# Patient Record
Sex: Female | Born: 1995 | Race: White | Hispanic: No | Marital: Single | Smoking: Never smoker
Health system: Southern US, Academic
[De-identification: ages and names within clinical notes are randomized; demographics above are authoritative.]

## PROBLEM LIST (undated history)

## (undated) DIAGNOSIS — R569 Unspecified convulsions: Secondary | ICD-10-CM

## (undated) DIAGNOSIS — M419 Scoliosis, unspecified: Secondary | ICD-10-CM

---

## 2015-10-23 ENCOUNTER — Other Ambulatory Visit (HOSPITAL_COMMUNITY): Payer: Self-pay | Admitting: EXTERNAL

## 2021-11-14 ENCOUNTER — Other Ambulatory Visit (HOSPITAL_COMMUNITY): Payer: Self-pay

## 2021-11-14 DIAGNOSIS — R1084 Generalized abdominal pain: Secondary | ICD-10-CM

## 2021-11-16 ENCOUNTER — Inpatient Hospital Stay
Admission: RE | Admit: 2021-11-16 | Discharge: 2021-11-16 | Disposition: A | Discharge status: Home / Self Care | Payer: Medicare Other | Source: Ambulatory Visit

## 2021-11-16 ENCOUNTER — Other Ambulatory Visit (HOSPITAL_BASED_OUTPATIENT_CLINIC_OR_DEPARTMENT_OTHER): Payer: Medicare Other

## 2021-11-16 ENCOUNTER — Other Ambulatory Visit: Payer: Self-pay

## 2021-11-16 ENCOUNTER — Other Ambulatory Visit (HOSPITAL_COMMUNITY): Payer: Medicare Other

## 2021-11-16 DIAGNOSIS — R1084 Generalized abdominal pain: Secondary | ICD-10-CM | POA: Insufficient documentation

## 2021-11-16 MED ORDER — IOHEXOL 350 MG IODINE/ML INTRAVENOUS SOLUTION
100.0000 mL | INTRAVENOUS | Status: AC
Start: 2021-11-16 — End: 2021-11-16
  Administered 2021-11-16: 75 mL via INTRAVENOUS

## 2021-11-20 ENCOUNTER — Other Ambulatory Visit (HOSPITAL_BASED_OUTPATIENT_CLINIC_OR_DEPARTMENT_OTHER): Payer: Medicare Other

## 2022-01-27 ENCOUNTER — Emergency Department
Admission: EM | Admit: 2022-01-27 | Discharge: 2022-01-27 | Disposition: A | Discharge status: Home / Self Care | Payer: Medicare Other | Attending: FAMILY PRACTICE | Admitting: FAMILY PRACTICE

## 2022-01-27 ENCOUNTER — Emergency Department (HOSPITAL_BASED_OUTPATIENT_CLINIC_OR_DEPARTMENT_OTHER): Payer: No Typology Code available for payment source

## 2022-01-27 ENCOUNTER — Encounter (HOSPITAL_BASED_OUTPATIENT_CLINIC_OR_DEPARTMENT_OTHER): Payer: Self-pay

## 2022-01-27 ENCOUNTER — Other Ambulatory Visit: Payer: Self-pay

## 2022-01-27 DIAGNOSIS — G40909 Epilepsy, unspecified, not intractable, without status epilepticus: Secondary | ICD-10-CM | POA: Insufficient documentation

## 2022-01-27 DIAGNOSIS — F061 Catatonic disorder due to known physiological condition: Secondary | ICD-10-CM

## 2022-01-27 HISTORY — DX: Scoliosis, unspecified: M41.9

## 2022-01-27 HISTORY — DX: Unspecified convulsions: R56.9

## 2022-01-27 LAB — CBC WITH DIFF
BASOPHIL #: 0.02 10*3/uL (ref 0.00–0.30)
BASOPHIL %: 0 % (ref 0–3)
EOSINOPHIL #: 0.14 10*3/uL (ref 0.00–0.80)
EOSINOPHIL %: 3 % (ref 0–7)
HCT: 38.9 % (ref 37.0–47.0)
HGB: 12.7 g/dL (ref 12.5–16.0)
LYMPHOCYTE #: 1.23 10*3/uL (ref 1.10–5.00)
LYMPHOCYTE %: 23 % — ABNORMAL LOW (ref 25–45)
MCH: 23.3 pg — ABNORMAL LOW (ref 27.0–32.0)
MCHC: 32.5 g/dL (ref 32.0–36.0)
MCV: 71.7 fL — ABNORMAL LOW (ref 78.0–99.0)
MONOCYTE #: 0.4 10*3/uL (ref 0.00–1.30)
MONOCYTE %: 7 % (ref 0–12)
MPV: 9.6 fL (ref 7.4–10.4)
NEUTROPHIL #: 3.66 10*3/uL (ref 1.80–8.40)
NEUTROPHIL %: 67 % (ref 40–76)
PLATELETS: 355 10*3/uL (ref 140–440)
RBC: 5.42 10*6/uL — ABNORMAL HIGH (ref 4.20–5.40)
RDW: 20.9 % — ABNORMAL HIGH (ref 11.6–14.8)
WBC: 5.5 10*3/uL (ref 4.0–10.5)

## 2022-01-27 LAB — BASIC METABOLIC PANEL
ANION GAP: 11 mmol/L (ref 10–20)
BUN/CREA RATIO: 11
BUN: 6 mg/dL — ABNORMAL LOW (ref 7–18)
CALCIUM: 8.7 mg/dL (ref 8.5–10.1)
CHLORIDE: 96 mmol/L — ABNORMAL LOW (ref 98–107)
CO2 TOTAL: 25 mmol/L (ref 21–32)
CREATININE: 0.57 mg/dL (ref 0.55–1.02)
ESTIMATED GFR: 129 mL/min/{1.73_m2} (ref >59)
GLUCOSE: 98 mg/dL (ref 74–106)
OSMOLALITY, CALCULATED: 262 mOsm/kg — ABNORMAL LOW (ref 270–290)
POTASSIUM: 4 mmol/L (ref 3.5–5.1)
SODIUM: 132 mmol/L — ABNORMAL LOW (ref 136–145)

## 2022-01-27 LAB — URINE DRUG SCREEN
AMPHETAMINES URINE: NEGATIVE
BARBITURATES URINE: NEGATIVE
BENZODIAZEPINES URINE: NEGATIVE
CANNABINOIDS URINE: NEGATIVE
COCAINE METABOLITES URINE: NEGATIVE
METHADONE URINE: NEGATIVE
OPIATES URINE: NEGATIVE
PCP URINE: NEGATIVE

## 2022-01-27 LAB — URINALYSIS, MACRO/MICRO
BILIRUBIN: NEGATIVE mg/dL
BLOOD: NEGATIVE mg/dL
GLUCOSE: NEGATIVE mg/dL
LEUKOCYTES: NEGATIVE WBCs/uL
NITRITE: NEGATIVE
PH: 7 (ref 4.6–8.0)
PROTEIN: NEGATIVE mg/dL
SPECIFIC GRAVITY: <=1.005 (ref 1.003–1.035)
UROBILINOGEN: 0.2 mg/dL (ref 0.2–1.0)

## 2022-01-27 LAB — ETHANOL, SERUM/PLASMA: ETHANOL: <3 mg/dL (ref <=3)

## 2022-01-27 LAB — HCG, URINE QUALITATIVE, PREGNANCY: HCG URINE QUALITATIVE: NEGATIVE

## 2022-01-27 LAB — POC BLOOD GLUCOSE (RESULTS): GLUCOSE, POC: 99 mg/dl (ref 50–500)

## 2022-01-27 LAB — ACETAMINOPHEN LEVEL: ACETAMINOPHEN LEVEL: 0

## 2022-01-27 LAB — TEGRETOL LEVEL: CARBAMAZEPINE LEVEL: 10.6 ug/mL (ref 4.0–12.0)

## 2022-01-27 MED ORDER — SODIUM CHLORIDE 0.9 % (FLUSH) INJECTION SYRINGE
3.0000 mL | INJECTION | Freq: Three times a day (TID) | INTRAMUSCULAR | Status: DC
Start: 2022-01-27 — End: 2022-01-27

## 2022-01-27 MED ORDER — SODIUM CHLORIDE 0.9 % (FLUSH) INJECTION SYRINGE
3.0000 mL | INJECTION | INTRAMUSCULAR | Status: DC | PRN
Start: 2022-01-27 — End: 2022-01-27

## 2022-01-27 NOTE — ED Nurses Note (Signed)
Patient in bed with seizure pads in place to both side rails. Father at bedside.

## 2022-01-27 NOTE — ED Provider Notes (Signed)
[facility], [facility] Emergency Department  ED Primary Provider Note  History of Present Illness   Chief Complaint   Patient presents with   . Seizure Prior Hx Of     Shannon Rodriguez is a 26 y.o. female who had concerns including Seizure Prior Hx Of.  Arrival: The patient arrived by Ambulance    This 26 year old female patient presents to the emergency department with seizures, EMS notes that she apparently has had several seizures today has had a history of seizures.  She takes Vimpat, gabapentin, and Tegretol for her seizures.  Patient does appear to have rhythmic type catatonic motion last for about 30-45 seconds, she is had 2 witnessed while I was in the room.  Her father states she is been evaluated [facility], and by neurosurgeon in [place] had not sure why she has the seizures.  He states he is had about 35 seizures so far today, they last 30 seconds to 45 seconds, no postictal state.  He says the seizures started at age 60 months.          Review of Systems   Pertinent positive and negative ROS as per HPI.  Historical Data   History Reviewed This Encounter:  Past medical surgical social history per dad and chart review, reviewed and noted.    Physical Exam   ED Triage Vitals   BP (Non-Invasive) 01/27/22 1735 (!) 144/97   Heart Rate 01/27/22 1735 100   Respiratory Rate 01/27/22 1733 18   Temperature 01/27/22 1735 37.4 C (99.3 F)   SpO2 01/27/22 1735 97 %   Weight 01/27/22 1733 56.2 kg (124 lb)   Height 01/27/22 1733 1.626 m (_0 )     Physical Exam   General:  No acute distress, nontoxic   Head: Normocephalic, atraumatic   Eyes:  Sclera is white, conjunctivae is pink.  PERRLA, EOMI   Ears:  TMs clear throat for traction   Nasal:  Pink and moist   Oral:  Pink and moist   Pharynx:  Pink moist without PND, exudate, petechiae   Neck:  Supple without accessory muscle use to breathe.  Trachea is in midline.    Lungs:  Clear symmetrical with good aeration   Heart: Regular rate  rhythm S1-S2 without murmur gallop   Abdomen:  Soft normal bowel sounds nontender   Extremities:  Moving symmetrically  Skin:  No suspicious rashes lesions   Neurological:  No focal motor sensory deficits.      Patient Data     Labs Ordered/Reviewed   BASIC METABOLIC PANEL - Abnormal; Notable for the following components:       Result Value    SODIUM 132 (*)     CHLORIDE 96 (*)     BUN 6 (*)     OSMOLALITY, CALCULATED 262 (*)     All other components within normal limits    Narrative:     Estimated Glomerular Filtration Rate (eGFR) is calculated using the CKD-EPI (2021) equation, intended for patients 38 years of age and older. If gender is not documented or "unknown", there will be no eGFR calculation.   CBC WITH DIFF - Abnormal; Notable for the following components:    RBC 5.42 (*)     MCV 71.7 (*)     MCH 23.3 (*)     RDW 20.9 (*)     LYMPHOCYTE % 23 (*)     All other components within normal limits   URINALYSIS,  MACRO/MICRO - Abnormal; Notable for the following components:    COLOR Light Yellow (*)     KETONES Trace (*)     All other components within normal limits   ACETAMINOPHEN LEVEL - Normal   ETHANOL, SERUM - Normal   URINE DRUG SCREEN - Normal   POC BLOOD GLUCOSE (RESULTS) - Normal   CBC/DIFF    Narrative:     The following orders were created for panel order CBC/DIFF.  Procedure                               Abnormality         Status                     ---------                               -----------         ------                     CBC WITH [facility][[id number]]                Abnormal            Final result                 Please view results for these tests on the individual orders.   URINALYSIS WITH REFLEX MICROSCOPIC AND CULTURE IF POSITIVE    Narrative:     The following orders were created for panel order URINALYSIS WITH REFLEX MICROSCOPIC AND CULTURE IF POSITIVE.  Procedure                               Abnormality         Status                     ---------                                -----------         ------                     URINALYSIS, MACRO/MICRO[[id number]]      Abnormal            Final result                 Please view results for these tests on the individual orders.   HCG, URINE QUALITATIVE, PREGNANCY   TEGRETOL LEVEL   PERFORM POC WHOLE BLOOD GLUCOSE     No orders to display     Medical Decision Making        Medical Decision Making  High complexity due presentation, could be catatonia, seizures, pseudoseizures, electrolyte imbalance, drug toxicities.    Amount and/or Complexity of Data Reviewed  Labs: ordered.  ECG/medicine tests: ordered.     Details: Sinus rhythm at 93 with normal axis normal intervals.      Risk  Prescription drug management.        ED Course as of 01/27/22 1933   Nancy Fetter Jan 27, 2022   1911 Patient re-evaluated, labs reviewed.  She has had no further seizures since the 1st half an hour  or so here in the emergency department.  With her in depth chart reviewed, and noted ictal SPECT imagery of the brain showing no seizures twice at [facility], and this looks like some sort of catatonic movement of short duration she will be discharged home, follow-up with the primary medical provider and neurology.  Continue home medications as prescribed.  Tegretol level still pending, patient can be notified if the level is high or low.         Medications Administered in the ED   NS flush syringe (has no administration in time range)   NS flush syringe (has no administration in time range)     Clinical Impression   Catatonia (Primary)       Disposition: Discharged    .Marland KitchenBretta Bang, DO

## 2022-01-27 NOTE — ED Triage Notes (Signed)
Father states the pt has had several seizures today and has been given a total of 9mg  ativan at intervals PO. 

## 2022-01-27 NOTE — ED Nurses Note (Signed)
Patient discharged home with family.  AVS reviewed with patient/care giver.  A written copy of the AVS and discharge instructions was given to the patient/care giver.  Questions sufficiently answered as needed.  Patient/care giver encouraged to follow up with PCP as indicated.  In the event of an emergency, patient/care giver instructed to call 911 or go to the nearest emergency room.

## 2022-01-27 NOTE — Discharge Instructions (Signed)
The appearance of your abnormal movements today of very short duration, no restful stayed or sleepy state afterwards maybe a catatonic reaction or conversion disorder with movement disorders.  Contents reviewed the chart, including SPECT imagery of the brain during a seizure-type event did not show any significant abnormalities.  Continue all of your home medications, follow-up with your primary care provider, and the neurologist that you have an appointment scheduled with in [place] as scheduled.

## 2022-01-28 LAB — ECG 12 LEAD
Atrial Rate: 93 {beats}/min
Calculated P Axis: 77 degrees
Calculated R Axis: 26 degrees
Calculated T Axis: 32 degrees
PR Interval: 158 ms
QRS Duration: 94 ms
QT Interval: 344 ms
QTC Calculation: 427 ms
Ventricular rate: 93 {beats}/min

## 2022-03-25 ENCOUNTER — Emergency Department
Admission: EM | Admit: 2022-03-25 | Discharge: 2022-03-25 | Disposition: A | Discharge status: Home / Self Care | Payer: Medicare Other | Attending: Family | Admitting: Family

## 2022-03-25 ENCOUNTER — Other Ambulatory Visit: Payer: Self-pay

## 2022-03-25 DIAGNOSIS — R569 Unspecified convulsions: Secondary | ICD-10-CM

## 2022-03-25 DIAGNOSIS — Z5329 Procedure and treatment not carried out because of patient's decision for other reasons: Secondary | ICD-10-CM

## 2022-03-25 DIAGNOSIS — G40901 Epilepsy, unspecified, not intractable, with status epilepticus: Secondary | ICD-10-CM

## 2022-03-25 LAB — COMPREHENSIVE METABOLIC PANEL, NON-FASTING
ALBUMIN/GLOBULIN RATIO: 1.2 (ref 0.8–1.4)
ALBUMIN: 3.9 g/dL (ref 3.4–5.0)
ALKALINE PHOSPHATASE: 68 U/L (ref 46–116)
ALT (SGPT): 25 U/L (ref <=78)
ANION GAP: 10 mmol/L (ref 4–13)
AST (SGOT): 19 U/L (ref 15–37)
BILIRUBIN TOTAL: 0.2 mg/dL (ref 0.2–1.0)
BUN/CREA RATIO: 16
BUN: 9 mg/dL (ref 7–18)
CALCIUM, CORRECTED: 8.5 mg/dL
CALCIUM: 8.4 mg/dL — ABNORMAL LOW (ref 8.5–10.1)
CHLORIDE: 95 mmol/L — ABNORMAL LOW (ref 98–107)
CO2 TOTAL: 26 mmol/L (ref 21–32)
CREATININE: 0.56 mg/dL (ref 0.55–1.02)
ESTIMATED GFR: 129 mL/min/{1.73_m2} (ref >59)
GLOBULIN: 3.3
GLUCOSE: 101 mg/dL (ref 74–106)
OSMOLALITY, CALCULATED: 262 mOsm/kg — ABNORMAL LOW (ref 270–290)
POTASSIUM: 4.5 mmol/L (ref 3.5–5.1)
PROTEIN TOTAL: 7.2 g/dL (ref 6.4–8.2)
SODIUM: 131 mmol/L — ABNORMAL LOW (ref 136–145)

## 2022-03-25 LAB — CBC WITH DIFF
BASOPHIL #: 0.01 10*3/uL (ref 0.00–0.30)
BASOPHIL %: 0 % (ref 0–3)
EOSINOPHIL #: 0.06 10*3/uL (ref 0.00–0.80)
EOSINOPHIL %: 2 % (ref 0–7)
HCT: 36.1 % — ABNORMAL LOW (ref 37.0–47.0)
HGB: 11.8 g/dL — ABNORMAL LOW (ref 12.5–16.0)
LYMPHOCYTE #: 0.96 10*3/uL — ABNORMAL LOW (ref 1.10–5.00)
LYMPHOCYTE %: 26 % (ref 25–45)
MCH: 23.2 pg — ABNORMAL LOW (ref 27.0–32.0)
MCHC: 32.7 g/dL (ref 32.0–36.0)
MCV: 71 fL — ABNORMAL LOW (ref 78.0–99.0)
MONOCYTE #: 0.35 10*3/uL (ref 0.00–1.30)
MONOCYTE %: 9 % (ref 0–12)
MPV: 7.7 fL (ref 7.4–10.4)
NEUTROPHIL #: 2.39 10*3/uL (ref 1.80–8.40)
NEUTROPHIL %: 63 % (ref 40–76)
PLATELETS: 286 10*3/uL (ref 140–440)
RBC: 5.08 10*6/uL (ref 4.20–5.40)
RDW: 20.3 % — ABNORMAL HIGH (ref 11.6–14.8)
WBC: 3.8 10*3/uL — ABNORMAL LOW (ref 4.0–10.5)

## 2022-03-25 LAB — CREATINE KINASE (CK), TOTAL, SERUM: CREATINE KINASE: 33 U/L (ref 26–192)

## 2022-03-25 LAB — LACTIC ACID LEVEL W/ REFLEX FOR LEVEL >2.0: LACTIC ACID: 0.9 mmol/L (ref 0.4–2.0)

## 2022-03-25 MED ORDER — SODIUM CHLORIDE 0.9 % (FLUSH) INJECTION SYRINGE
3.0000 mL | INJECTION | INTRAMUSCULAR | Status: DC | PRN
Start: 2022-03-25 — End: 2022-03-25

## 2022-03-25 MED ORDER — LORAZEPAM 2 MG TABLET
2.0000 mg | ORAL_TABLET | Freq: Four times a day (QID) | ORAL | 0 refills | Status: DC
Start: 2022-03-25 — End: 2023-03-21

## 2022-03-25 MED ORDER — LORAZEPAM 2 MG/ML INJECTION WRAPPER
1.0000 mg | INTRAMUSCULAR | Status: AC
Start: 2022-03-25 — End: 2022-03-25
  Administered 2022-03-25: 1 mg via INTRAVENOUS

## 2022-03-25 MED ORDER — LORAZEPAM 1 MG TABLET
ORAL_TABLET | ORAL | Status: AC
Start: 2022-03-25 — End: 2022-03-25
  Filled 2022-03-25: qty 1

## 2022-03-25 MED ORDER — SODIUM CHLORIDE 0.9 % IV BOLUS
1000.0000 mL | INJECTION | Status: AC
Start: 2022-03-25 — End: 2022-03-25
  Administered 2022-03-25: 1000 mL via INTRAVENOUS
  Administered 2022-03-25: 0 mL via INTRAVENOUS

## 2022-03-25 MED ORDER — LORAZEPAM 2 MG/ML INJECTION SYRINGE
INJECTION | INTRAMUSCULAR | Status: AC
Start: 2022-03-25 — End: 2022-03-25
  Filled 2022-03-25: qty 1

## 2022-03-25 MED ORDER — SODIUM CHLORIDE 0.9 % (FLUSH) INJECTION SYRINGE
3.0000 mL | INJECTION | Freq: Three times a day (TID) | INTRAMUSCULAR | Status: DC
Start: 2022-03-25 — End: 2022-03-25

## 2022-03-25 MED ORDER — SODIUM CHLORIDE 0.9 % INTRAVENOUS SOLUTION
1000.0000 mg | Freq: Two times a day (BID) | INTRAVENOUS | Status: DC
Start: 2022-03-25 — End: 2022-03-25
  Filled 2022-03-25: qty 10

## 2022-03-25 MED ORDER — LORAZEPAM 1 MG TABLET
1.0000 mg | ORAL_TABLET | ORAL | Status: AC
Start: 2022-03-25 — End: 2022-03-25
  Administered 2022-03-25: 1 mg via ORAL

## 2022-03-25 NOTE — ED Nurses Note (Signed)
Mother states pt is prescribed 2mg  of Ativan 5x/day and without more Ativan, the seizures will continue. Notified provider. Provider also attempted to contact MPOA (father- ) and there was no answer and no way to leave a message.

## 2022-03-25 NOTE — ED Nurses Note (Addendum)
Patient discharged home with family.  AVS reviewed with patient/care giver.  A written copy of the AVS and discharge instructions was given to the patient/care giver. Scripts handed to patient/care giver. Questions sufficiently answered as needed.  Patient/care giver encouraged to follow up with PCP as indicated.  In the event of an emergency, patient/care giver instructed to call 911 or go to the nearest emergency room.  Respirations even and unlabored at this time

## 2022-03-25 NOTE — ED Triage Notes (Signed)
Ems reports pt has hx seizures, pt reportedly has had 9 seizures today. Ems reports they witnessed 2 episodes where pt draws up, can't speak for a few seconds and then she is back to her normal. Pt's mother reports that these episodes lead to grand mal seizures. Mother reports the heat outside has been making her have these episodes.

## 2022-03-25 NOTE — ED Nurses Note (Signed)
Patient discharged home with family.  AVS reviewed with patient/care giver.  A written copy of the AVS and discharge instructions was given to the patient/care giver. Scripts handed to patient/care giver Also gave pt medication to take at home. Questions sufficiently answered as needed.  Patient/care giver encouraged to follow up with PCP as indicated.  In the event of an emergency, patient/care giver instructed to call 911 or go to the nearest emergency room.

## 2022-03-25 NOTE — ED Provider Notes (Signed)
[facility], [facility] Emergency Department  ED Primary Provider Note  History of Present Illness   Chief Complaint   Patient presents with    Seizure Prior Hx Of     Arrival: The patient arrived by Ambulance    Shannon Rodriguez is a 26 y.o. female who had concerns including Seizure Prior Hx Of. Mother states pt has had 2 sz pta. Had 2 pills of 2 mg ativan left filled 7-25.  No 150 tablets. Takes 5 times a day. Pt ao awake talking on arrival.     Review of Systems   Constitutional: No fever, chills or weakness   Skin: No rash or diaphoresis  HENT: No headaches, or congestion  Eyes: No vision changes or photophobia   Cardio: No chest pain, palpitations or leg swelling   Respiratory: No cough, wheezing or SOB  GI:  No nausea, vomiting or stool changes  GU:  No dysuria, hematuria, or increased frequency  MSK: No muscle aches, joint or back pain  Neuro+ seizures, no  LOC, numbness, tingling, or focal weakness  Psychiatric: No depression, SI or substance abuse  All other systems reviewed and are negative.    Historical Data   History Reviewed This Encounter: all noted and reviewed    Physical Exam   ED Triage Vitals [03/25/22 1834]   BP (Non-Invasive) (!) 141/100   Heart Rate 86   Respiratory Rate 16   Temperature 37.7 C (99.8 F)   SpO2 98 %   Weight 56.2 kg (124 lb)   Height 1.626 m ('5\' 4"' )       Constitutional:  26 y.o. female who appears in no distress. Normal color, no cyanosis.   HENT:   Head: Normocephalic and atraumatic.   Mouth/Throat: Oropharynx is clear and moist.   Eyes: EOMI, PERRL   Neck: Trachea midline. Neck supple.  Cardiovascular: RRR, No murmurs, rubs or gallops. Intact distal pulses.  Pulmonary/Chest: BS equal bilaterally. No respiratory distress. No wheezes, rales or chest tenderness.   Abdominal: Bowel sounds present and normal. Abdomen soft, no tenderness, no rebound and no guarding.  Back: No midline spinal tenderness, no paraspinal tenderness, no CVA tenderness.            Musculoskeletal: No edema, tenderness or deformity.  Skin: warm and dry. No rash, erythema, pallor or cyanosis  Psychiatric: normal mood and affect. Behavior is normal.   Neurological: Patient keenly alert and responsive, easily able to raise eyebrows, facial muscles/expressions symmetric, speaking in fluent sentences, moving all extremities equally and fully.   Patient Data     Labs Ordered/Reviewed   COMPREHENSIVE METABOLIC PANEL, NON-FASTING - Abnormal; Notable for the following components:       Result Value    SODIUM 131 (*)     CHLORIDE 95 (*)     CALCIUM 8.4 (*)     OSMOLALITY, CALCULATED 262 (*)     All other components within normal limits    Narrative:     Estimated Glomerular Filtration Rate (eGFR) is calculated using the CKD-EPI (2021) equation, intended for patients 1 years of age and older. If gender is not documented or "unknown", there will be no eGFR calculation.   CBC WITH DIFF - Abnormal; Notable for the following components:    WBC 3.8 (*)     HGB 11.8 (*)     HCT 36.1 (*)     MCV 71.0 (*)     MCH 23.2 (*)     RDW  20.3 (*)     LYMPHOCYTE # 0.96 (*)     All other components within normal limits   CREATINE KINASE (CK), TOTAL, SERUM - Normal   LACTIC ACID LEVEL W/ REFLEX FOR LEVEL >2.0 - Normal   CBC/DIFF    Narrative:     The following orders were created for panel order CBC/DIFF.  Procedure                               Abnormality         Status                     ---------                               -----------         ------                     CBC WITH FTDD[[id number]]                Abnormal            Final result                 Please view results for these tests on the individual orders.     No orders to display     Medical Decision Making   Diff dx of sz disorder .break thru sz.   Mother reports child needing 2 to 4 more mg of ativan. Advised not indicated to give more than 2 iv at this point. She is awake oriented ck lactic acid winl. She refused Keppra iv stated pt was  allergic to it.    56 discussed case with dr. Nelson Chimes offer admission. He reviewed labs. Mother refused admission. States she dont need it she has neuro appt with a new neurologist .and just needs her ativan.   2020 mother states at DC pt needs another mg of ativan iv. I do not feel this is indicated. She had one witnessed episode of stiffening of the rt arm . She had no post ictal phase and was oriented  . Father Nadine Counts, was placed on speaker phone with mother, i asked to oteng to evaluate the pt since i didn't feel further iv ativan was appropriate. 2040 mother screaming that pt didn't get an appropriate dose of ativan of initial ativan. States this is status epilepticus . She looked up and  growled she is having a seizure. Dr Nelson Chimes advised her he didn't feel the ativan was indicated and that he was not writing it. I advised her that i wrote no 20 pills and one pill to go  but she continued to ask for iv ativan and i asked dr Nelson Chimes for another opinion . Iv felt additional iv was inappropriate dose for her size and given she was alert and talking to me. Mother was verbally abuse to dr Nelson Chimes among many witnesses, screaming she wants his license. Multiple witnesses to conversation, devin, security monica bowman, rn, brandy p. Rn, all wintessed conversation mpoa father asking the mother to calm down and stop yelling at staff. They refused admission. I offered the mpoa, the pt and the mother on 3 different accounts. Dr. Nelson Chimes offered admission twice they refused. They want to go home. Advised mother she may return anytime for transfer to location where  there is neurologist.     Medications Administered in the ED   NS flush syringe (has no administration in time range)   NS flush syringe (has no administration in time range)   NS bolus infusion 1,000 mL (1,000 mL Intravenous New Bag/New Syringe 03/25/22 1916)   LORazepam (ATIVAN) tablet (has no administration in time range)   LORazepam (ATIVAN) 2 mg/mL injection (1  mg Intravenous Given 03/25/22 1915)   LORazepam (ATIVAN) 2 mg/mL injection (1 mg Intravenous Given 03/25/22 1949)     Clinical Impression   Seizure disorder (CMS HCC) (Primary)       Disposition: Discharged

## 2022-03-25 NOTE — ED Nurses Note (Signed)
Mother states pt has had seizures since she was 67 months old. Mother states that pt has had 27 seizures today and that the only thing that helps is Ativan. Provider Lalla Brothers) wanted to admit pt for observation and mother immediately stated she did not want pt admitted. I was witness to conversation with pt and mother with provider. Mother states pt has a new neurologist and she would rather follow up with the neurologist rather than admission.

## 2022-03-25 NOTE — ED Attending Handoff Note (Addendum)
MDM:    2057. I was asked to assist with the care of this patient.  Reviewed chart.  She has a complex seizure history maintained on Vimpat, gabapentin and Tegretol.  Previous documentation notes that these episodes manifest as short catatonic movements.  She is had to two negative SPECT imaging at [facility].  Today, the family asserts that the patient is in status epilepticus.  I did speak with nursing and the nurse practitioner and they asserts that the patient has not had a seizure here in the department following 2 hours of observation. See nursing documentation. Lab work including CPK is not supportive of the diagnosis of prolonged seizure.  NP Lalla Brothers is concerned that the family is asking for large doses of IV Ativan and high-dose prescription of Ativan on discharge.  The patient is also received IV Ativan for reasons which I am unclear.       Both patient's mother and father are power-of-attorney.  I did get the father on the phone and got all members of the team nursing and NP Lalla Brothers in the room.  I confirmed key aspects of the patient's history.  The patient herself attest that she has had seizures here.  The mother was quite angry and yelling that the patient is in status and requires 4 mg of Ativan IV.  My independent assessment the patient is fully awake alert nonconvulsive and interactive in the conversation.  The patient states that she feels near normal but is not 100%.  I offered admission to the family so they can be directly observed by Neurology.  Both the mother/father and patient declined stating only what is IV Ativan and a script of Ativan to carry them till the the end of the month or possibly till September.  I told the family was not appropriate to dose the patient with IV Ativan at this time she is not actively seizing.  I explained there are multiple types of seizures particularly epileptic and nonepileptic and they require different treatments.  The patient is not having a full epileptic  seizure which requires IV Ativan.    The mother became incredibly irate yelling the father joint did as well.  I did attempt to redirect the family and was only partially successful.  Mother command of the son who was also in the room to start recording and on command the patient did have rhythmic movements.  During this episode which I asked the patient what she was doing.  She was quite conversant.  Episode lasted approximately 30 seconds and there was no postictal state. Yelling from the parents went on for about 10 minutes with security at the door as I attempted to calm down.     I once again offered admission or prolonged observation here as the family is concerned about the status of the patient.  Once again the family declined.  Mother continued to yell requested Ativan.  I calming asserted that IV Ativan is not indicated here and a significant deviation from standard of care.  A few tablets of the Ativan prescription may be reasonable until the family can reengaged with their primary care provider who prescribed the medication.  He stated the primary care refused to give more Ativan and referred the patient to the ED.  My orders is for no IV Ativan and will defer to the NP if she chooses to write a script for p.o. Ativan.  Note the outpatient script would be against my recommendation.    I made  a final attempt to comfort the family again offered admission and prolonged observation here.  Mother yelled "GOD may have mercy on your  soul", demanded my NPI number and threatened to sue for negligence.        Discharged  Clinical Impression   Nonepileptic episode (CMS HCC) (Primary)     Medications Administered in the ED   NS flush syringe (has no administration in time range)   NS flush syringe (has no administration in time range)   LORazepam (ATIVAN) 2 mg/mL injection (1 mg Intravenous Given 03/25/22 1915)   NS bolus infusion 1,000 mL (1,000 mL Intravenous New Bag/New Syringe 03/25/22 1916)   LORazepam (ATIVAN)  2 mg/mL injection (1 mg Intravenous Given 03/25/22 1949)   LORazepam (ATIVAN) tablet (1 mg Oral Given 03/25/22 2050)        Current Discharge Medication List        START taking these medications.        Details   LORazepam 2 mg Tablet  Commonly known as: ATIVAN   2 mg, Oral, 4 TIMES DAILY  Qty: 20 Tablet  Refills: 0

## 2022-03-25 NOTE — ED Nurses Note (Addendum)
Witnessed conversation with Dr. Katrinka Blazing and Zannie Kehr with patient, mother, father (via phone) and brother. Admission was offered multiple times and refused each time. Mother and pt became very upset when informed by provider that he would not be giving any further Ativan via IV as it was not necessary in her care at this time. Mother became more agitated as did pt and began yelling at provider and asking for his medical license #. Mother then started recording pt on her cell phone insisting the pt was seizing right at that time and  instructed the pts brother to record we well. When mother instructed the brother to record, the pt looked at the brother and nodded yes .Dr. Katrinka Blazing gave his name multiple times. Mother and pt insisted pt was having seizure after seizure and was provider informed that staff had not witnessed. Both mother and pt continued to yell at provider and was asked multiple times to please stop yelling. Security called to room. Pt and mother continued yelling until Dr. Katrinka Blazing left room. Mother states "i will do whatever it takes to have his job and I won't stop until I do." This was in reference to Dr. Katrinka Blazing. I tried to explain to mother and pt as well that no one in the ED involved in her care was implying that the pt did not have seizures only that they were not witnessed by staff. Pt and mother stated "We are very thankful for you and the other lady (Ameneh), but he needs fired."

## 2022-03-25 NOTE — ED Nurses Note (Signed)
Pt and mother verbalized understanding to discharge instructions. Assisted pt to vehicle by wheelchair and assisted to car.

## 2022-05-03 ENCOUNTER — Other Ambulatory Visit (RURAL_HEALTH_CENTER): Payer: Self-pay | Admitting: Family Medicine

## 2022-12-07 NOTE — Progress Notes (Signed)
 General Surgery History and Physical/Consult   CC: Femoral hernia                                                                             HPI:  Shannon Rodriguez is a 27 y.o. female with a history of seizure disorder and scoliosis who presents to clinic for evaluation of a left femoral hernia.  She is accompanied by her father who is her primary caregiver.  She reports she is having left-sided abdominal pain that is worse after eating.  She denies any bulge or skin changes. There is also tenderness to palpation on the left lower quadrant/groin.  She is known about this hernia for over a year and was being worked up to have it repaired with Dr. Juliet Ogle.  Due to her seizure disorder she had tried to get preoperative risk stratification from her neurologist but has yet been unable to get a response.  She has an appointment with a neurologist in the next 2 weeks.    She reports she has seizures 4-5 times per week.  Her last seizure was 2 nights ago.  This has been the case since she was an infant and she is on multiple antiepileptics.    PMH: has a past medical history of Scoliosis, Seizure disorder (HCC), and Vaginal prolapse.  PSH:  has no past surgical history on file.  ALLERGIES: Depakote [divalproex], Keppra [levetiracetam], Lamictal [lamotrigine], Lidocaine, Neurontin [gabapentin], Onfi [clobazam], Phenobarbital, Phenytoin, Topamax [topiramate], Trileptal [oxcarbazepine], and Zonegran [zonisamide]  MEDS:   Current Outpatient Medications on File Prior to Visit   Medication Indication(s) Sig Dispense Refill   . Ondansetron 8 mg Tablet, Rapid Dissolve   take 1 tablet four times daily as needed by mouth .     Aaron Aas VIMPAT 50 mg Tablet   take 1 tablet every morning by mouth .     . Magnesium 250 mg Tablet   take 1 tablet every day by mouth .     . norethindrone-e.estradioL-iron (LO LOESTRIN FE) 1 mg-10 mcg (24)/10 mcg (2) Tablet   take 1 tablet every morning by mouth .     Aaron Aas LORazepam (ATIVAN) 2 mg Tablet   take 2  mg every 6 (six) hours as needed by mouth  for Anxiety.     . carbamazepine XR (TEGRETOL) 100 mg Tablet Sustained Release 12HR   take 7 Tabs by mouth two times daily 7 tablets twice a day 420 Tab 2   . Lacosamide (VIMPAT) 100 mg Tablet   take 1 Tab by mouth two times daily 1 tablet in the morning and 2 tablets at night 90 Tab 3     No current facility-administered medications on file prior to visit.       FHX:   Family History   Problem Relation Name Age of Onset   . No Known Problems Mother     . No Known Problems Father       SHX:  reports that she has never smoked. She has never used smokeless tobacco. She reports that she does not drink alcohol and does not use drugs.    Review of Systems   Constitutional:  Negative for chills, fever and weight  loss.   HENT:  Negative for congestion and hearing loss.    Eyes:  Negative for blurred vision.   Respiratory:  Negative for cough and shortness of breath.    Cardiovascular:  Negative for chest pain and claudication.   Gastrointestinal:  Positive for abdominal pain. Negative for constipation, diarrhea, heartburn and vomiting.   Genitourinary:  Negative for frequency and urgency.   Musculoskeletal: Negative.    Skin:  Negative for itching and rash.   Neurological:  Positive for speech change and seizures. Negative for dizziness, sensory change and weakness.   Endo/Heme/Allergies:  Does not bruise/bleed easily.   Psychiatric/Behavioral: Negative.           PHYSICAL EXAM:    Blood pressure 120/80, pulse 69, height 1.626 m (5\' 4" ), weight 55.8 kg (123 lb).  General: alert, well appearing, and in no distress.  HEENT: NC/AT.  Conjunctivae pink. Mucous membranes moist.  Lungs: Unlabored breathing on room air   CVS: Regular rate and rhythm   JO:ACZY, ND, tender in LLQ and left groin, no palpable hernia   Skin:Warm/dry, normal coloration and turgor   Ext: No edema noted. No deformities.    Neuro: grossly intact.  Psych: Appropriate mood, flat affect    LABS & IMAGING:   No  results for input(s): "WBC", "HGB", "HCT", "PLT", "LYMPHOPCT", "BANDSPCT", "MONOPCT", "EOSPCT", "BASOPCT", "ATYLYMABS" in the last 72 hours.    Invalid input(s): "POLY"  No results for input(s): "NA", "K", "CL", "CO2", "BUN", "CREATININE", "CA", "MG", "PHOS" in the last 72 hours.  No results for input(s): "GLU" in the last 72 hours.  No results for input(s): "PROT", "ALBUMIN", "GLO", "BILITOT", "BILIDIR", "AST", "ALT", "ALKPHOS", "AMYLASE", "LIPASE" in the last 72 hours.  No results for input(s): "PROTIME", "INR", "PTT" in the last 72 hours.    Celina B Mersch     RADIOLOGIST: Nell Bamberger, MD     CT ABDOMEN PELVIS W IV CONTRAST performed on 11/16/2021 9:40 AM     CLINICAL HISTORY: R10.84: Generalized abdominal pain.   ABDOMINAL PAIN     TECHNIQUE:  Abdomen and pelvis CT with intravenous contrast.   IV CONTRAST: 75 ml's of Omnipaque 350     COMPARISON:  None.   # of known CTs in the past 12 months:  0   # of known Cardiac Nuclear Medicine Studies in the past 12 months:  0     FINDINGS:   Lung bases: Clear     Liver:   Unremarkable.     Gallbladder:   Unremarkable.     Spleen:   Unremarkable.     Pancreas:   Unremarkable.     Adrenals:   Unremarkable.     Kidneys:   Unremarkable.     Bladder:  Unremarkable.     Uterus and Adnexa:  The uterus is unremarkable. There is a right adnexal cyst measuring 2.3 cm. The left adnexa is unremarkable.     Bowel:   The patient recently received barium for another exam at an outside facility. There is barium in the mid and distal colon. There is a large amount of stool throughout the colon suggesting constipation. Correlate with signs and symptoms.     Appendix:  Normal.     Lymph nodes:  No suspicious lymph node enlargement.     Vasculature:   Major vascular structures are unremarkable.     Peritoneum / Retroperitoneum: No ascites.  No free air.     Bones:   There is moderate  to severe scoliosis of the upper lumbar spine convex to left and of the included thoracic spine convex  to the right. There is some mild degenerative change of the sacroiliac joints.     There is a left femoral hernia containing fat only.     ASSESSMENT: 27 y.o. female with scoliosis and seizure disorder who presents with a left femoral hernia.  This is a high risk hernia and robotic assisted repair is recommended.  She has had almost daily seizures for her entire life and I think the risk of  strangulation of her femoral hernia is greater than the perioperative risk caused by her seizure disorder.    PLAN:       Schedule robotic assisted left femoral hernia repair  Consent signed in clinic                              Dominique Frieze, MD  10:21 AM  12/07/2022      *Portions of this note were dictated using voice recognition software and some transcription errors may be present.  If there is any question regarding the content above please do not hesitate to contact the author for clarification.*

## 2022-12-31 ENCOUNTER — Other Ambulatory Visit (HOSPITAL_COMMUNITY): Payer: Self-pay

## 2022-12-31 DIAGNOSIS — R569 Unspecified convulsions: Secondary | ICD-10-CM

## 2023-01-20 ENCOUNTER — Other Ambulatory Visit (HOSPITAL_COMMUNITY): Payer: Medicare Other

## 2023-01-29 IMAGING — MR MRI BRAIN W/O CONTRAST
9 series · 48 of 48 positions shown · IV contrast (gadolinium)
Comparison: None available.

﻿EXAM:  MRI BRAIN W/O CONTRAST
INDICATION: 26-year-old with history of seizures.
TECHNIQUE: Multiplanar, multisequential MRI of the brain was performed without gadolinium contrast.

[Series 5: DWI · axial · 5.5mm · 1.35mm/px · z∈[-77,+51]mm · 15 of 88 slices shown (1 of 3)]
[im 1/88]
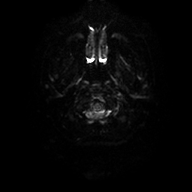
[im 7/88]
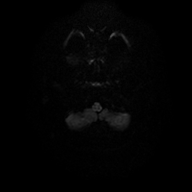
[im 13/88]
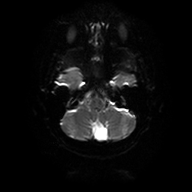
[im 19/88]
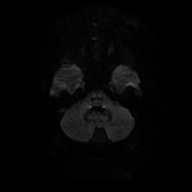
[im 25/88]
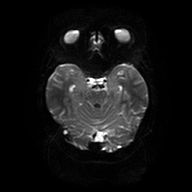
[im 32/88]
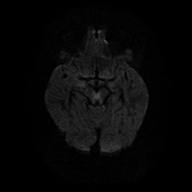
[im 38/88]
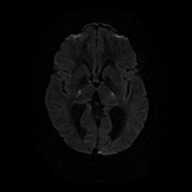
[im 44/88]
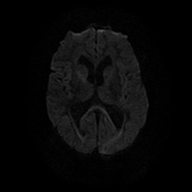
[im 50/88]
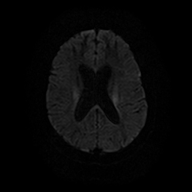
[im 56/88]
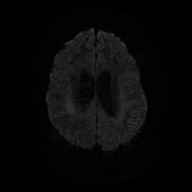
[im 63/88]
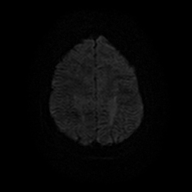
[im 69/88]
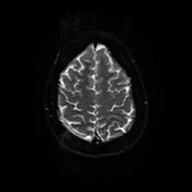
[im 75/88]
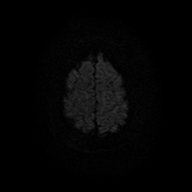
[im 81/88]
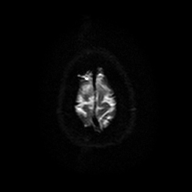
[im 88/88]
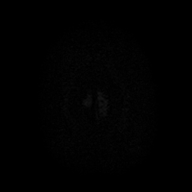

[Series 6: DWI · axial · 5.5mm · 1.35mm/px · z∈[-77,+51]mm · 4 of 22 slices shown (2 of 3)]
[im 1/22]
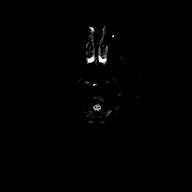
[im 8/22]
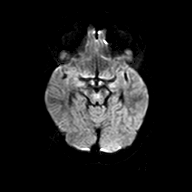
[im 15/22]
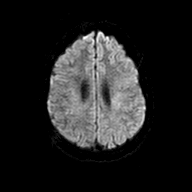
[im 22/22]
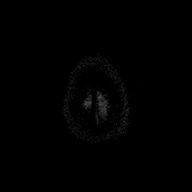

[Series 7: DWI · axial · 5.5mm · 1.35mm/px · z∈[-77,+51]mm · 4 of 22 slices shown (3 of 3)]
[im 1/22]
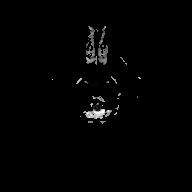
[im 8/22]
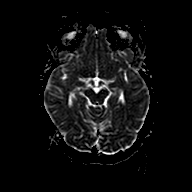
[im 15/22]
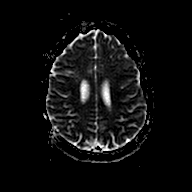
[im 22/22]
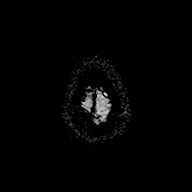

[Series 8: FLAIR · sagittal · 4.0mm · 0.75mm/px · 5 of 26 slices shown (1 of 2)]
[im 1/26]
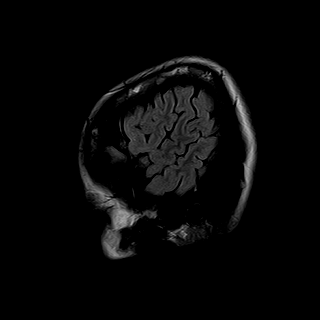
[im 7/26]
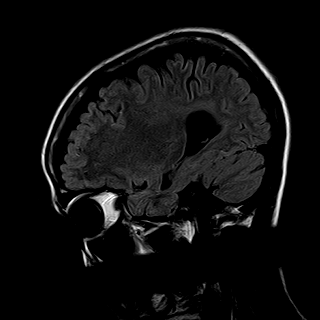
[im 13/26]
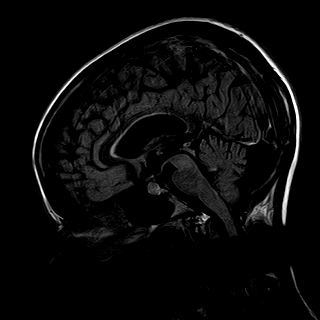
[im 19/26]
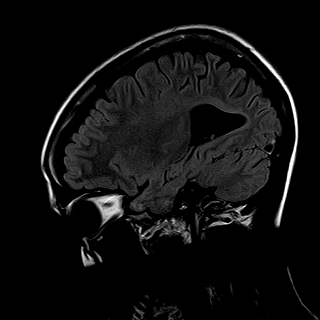
[im 26/26]
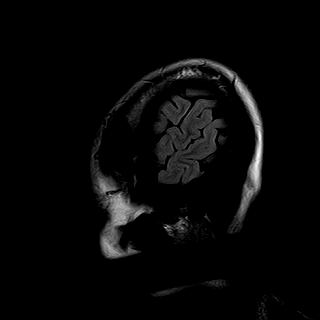

[Series 9: T2 · axial · 5.0mm · 0.43mm/px · z∈[-79,+53]mm · 4 of 25 slices shown]
[im 1/25]
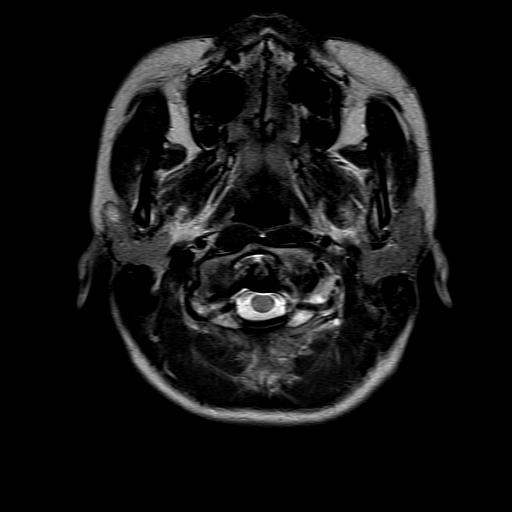
[im 9/25]
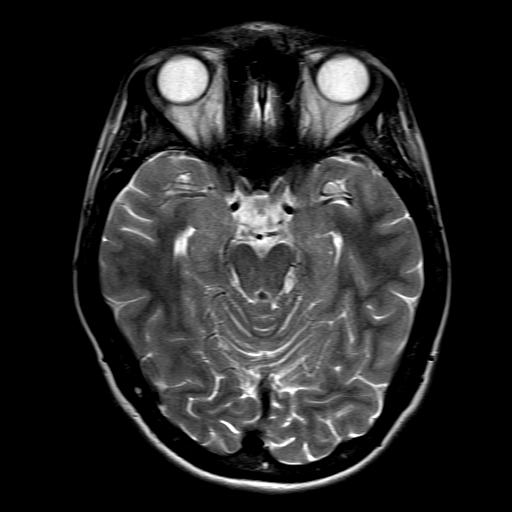
[im 17/25]
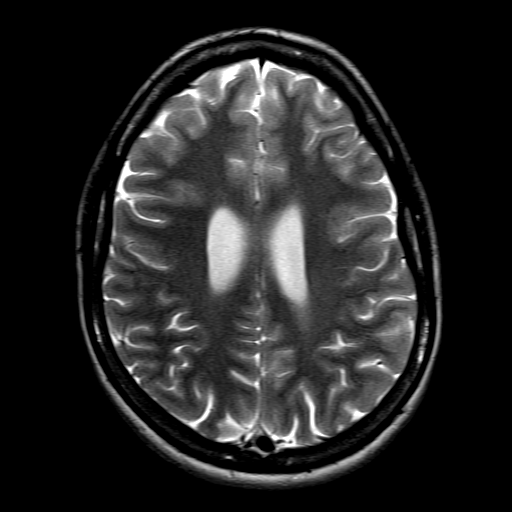
[im 25/25]
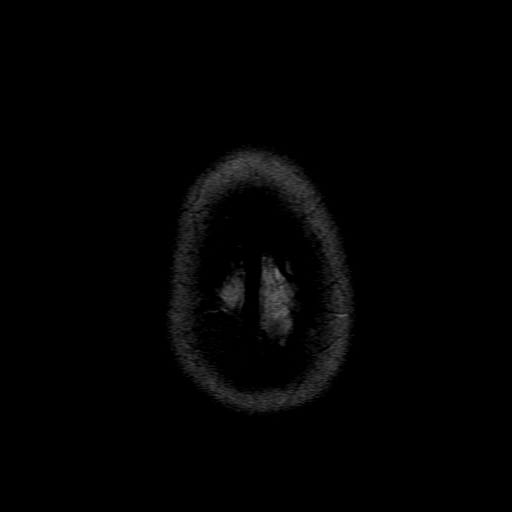

[Series 10: FLAIR · axial · 5.0mm · 0.76mm/px · z∈[-71,+45]mm · 4 of 22 slices shown (2 of 2)]
[im 1/22]
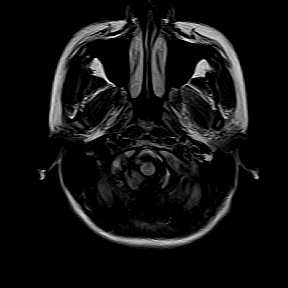
[im 8/22]
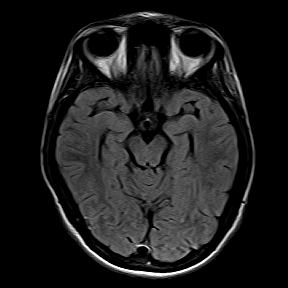
[im 15/22]
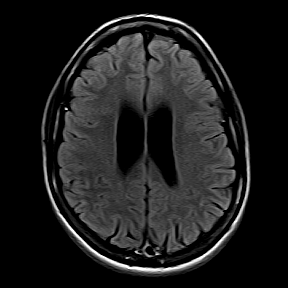
[im 22/22]
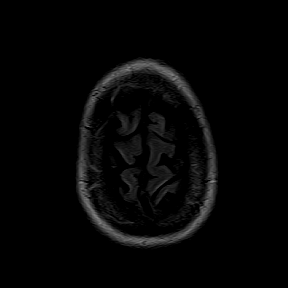

[Series 11: T1 · axial · 5.0mm · 0.69mm/px · z∈[-71,+45]mm · 4 of 22 slices shown]
[im 1/22]
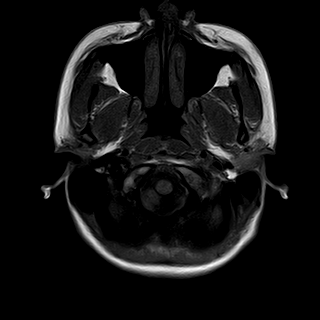
[im 8/22]
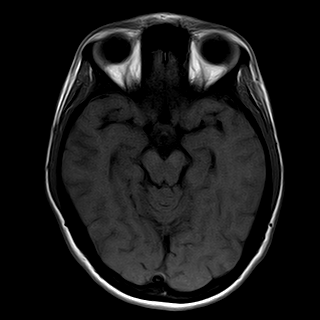
[im 15/22]
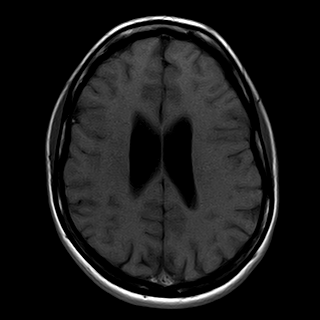
[im 22/22]
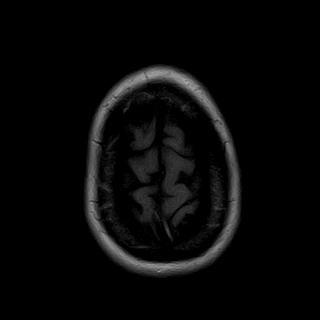

[Series 12: T2-star · axial · 5.0mm · 0.69mm/px · z∈[-71,+45]mm · 4 of 22 slices shown (1 of 2)]
[im 1/22]
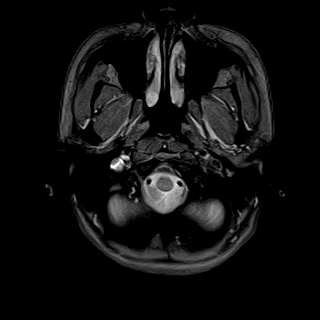
[im 8/22]
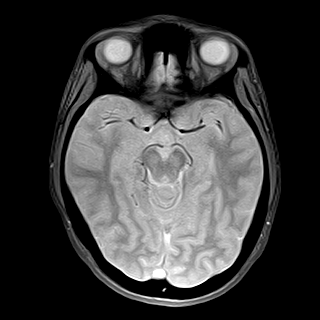
[im 15/22]
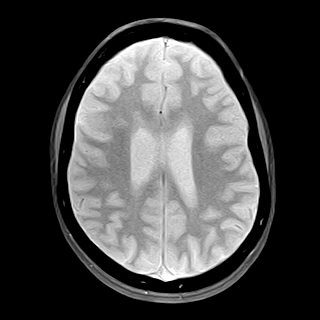
[im 22/22]
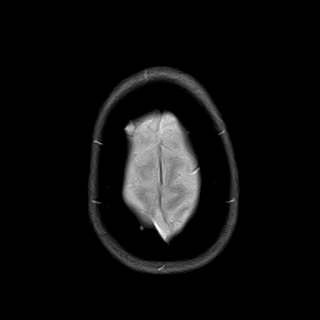

[Series 13: T2-star · coronal · 5.5mm · 0.43mm/px · 4 of 24 slices shown (2 of 2)]
[im 1/24]
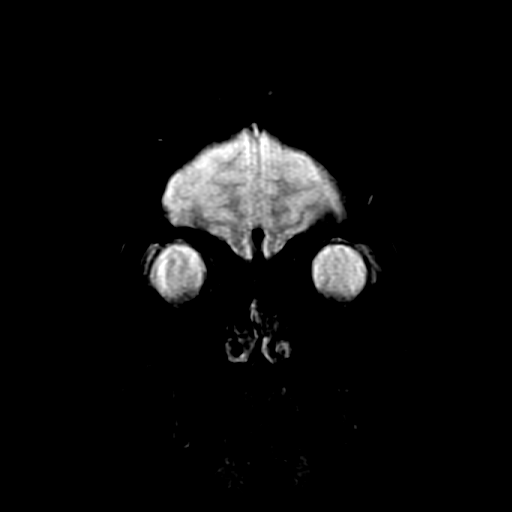
[im 8/24]
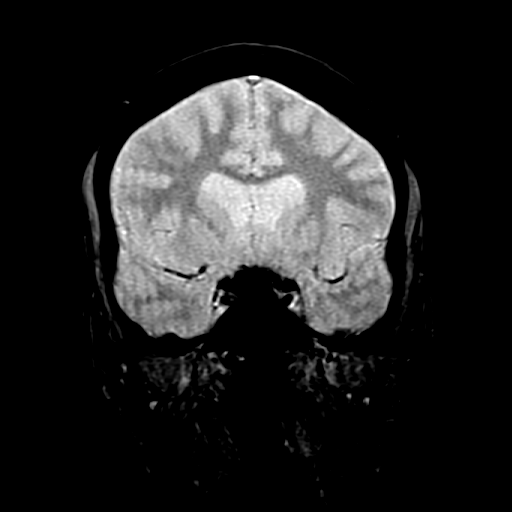
[im 16/24]
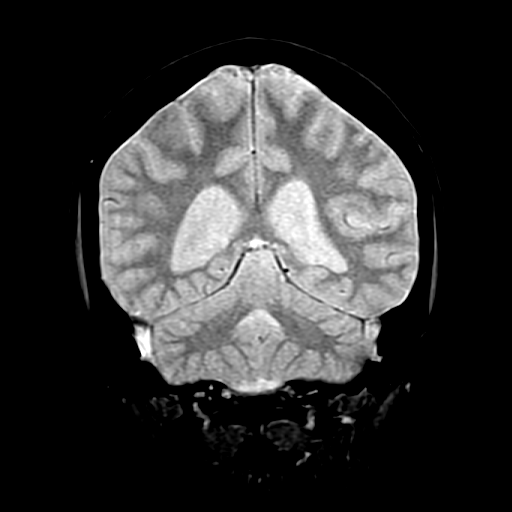
[im 24/24]
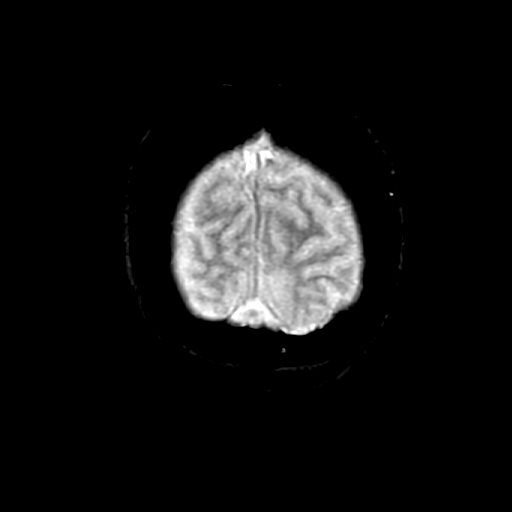

[48 of 48 positions shown; findings below may reference images not displayed]

FINDINGS: No focal areas of restricted diffusion.  No evidence of intracranial bleed or extra-axial collections.  No evidence of ventriculomegaly or midline shift. 

Within the posterior fossa, cystic lesion is noted to the left of the midline, posterior to the cerebellum measuring 3.7 x 2.6 cm in sagittal dimensions suggestive of arachnoid cyst.  No other space-occupying lesions of the posterior fossa are seen.  Major arteries of circle of Willis are patent. 

Prominent pituitary gland measuring 11 x 8 mm in sagittal dimensions with suprasellar extension in the midline.
IMPRESSION: 1. Well-circumscribed cystic lesion of the posterior cranial fossa, measuring 3.7 x 2.6 cm in sagittal dimensions suggestive of benign arachnoid cyst.

2. No ventriculomegaly or midline shift.

3. Major arteries of circle of Willis and dural venous sinuses are patent.

4. Prominent size of the pituitary gland with suprasellar extension in the midline.  Possibility of pituitary tumor is not ruled out.  If symptoms warrant, further evaluation of pituitary gland by MRI with and without contrast is recommended.  Please correlate with pituitary function test results.

## 2023-02-08 ENCOUNTER — Other Ambulatory Visit (HOSPITAL_COMMUNITY): Payer: Medicare Other

## 2023-03-21 ENCOUNTER — Other Ambulatory Visit: Payer: Self-pay

## 2023-03-21 ENCOUNTER — Emergency Department
Admission: EM | Admit: 2023-03-21 | Discharge: 2023-03-21 | Disposition: A | Discharge status: Home / Self Care | Payer: Medicare Other | Attending: Emergency Medicine | Admitting: Emergency Medicine

## 2023-03-21 ENCOUNTER — Encounter (HOSPITAL_BASED_OUTPATIENT_CLINIC_OR_DEPARTMENT_OTHER): Payer: Self-pay

## 2023-03-21 DIAGNOSIS — G40409 Other generalized epilepsy and epileptic syndromes, not intractable, without status epilepticus: Secondary | ICD-10-CM | POA: Insufficient documentation

## 2023-03-21 DIAGNOSIS — Z760 Encounter for issue of repeat prescription: Secondary | ICD-10-CM | POA: Insufficient documentation

## 2023-03-21 MED ORDER — LORAZEPAM 2 MG TABLET
2.0000 mg | ORAL_TABLET | Freq: Every day | ORAL | 0 refills | Status: DC
Start: 2023-03-21 — End: 2023-11-15

## 2023-03-21 MED ORDER — LORAZEPAM 1 MG TABLET
2.0000 mg | ORAL_TABLET | ORAL | Status: AC
Start: 2023-03-21 — End: 2023-03-21
  Administered 2023-03-21: 2 mg via ORAL

## 2023-03-21 MED ORDER — ONDANSETRON 4 MG DISINTEGRATING TABLET
4.0000 mg | ORAL_TABLET | Freq: Three times a day (TID) | ORAL | 0 refills | Status: AC | PRN
Start: 2023-03-21 — End: ?

## 2023-03-21 MED ORDER — LORAZEPAM 1 MG TABLET
ORAL_TABLET | ORAL | Status: AC
Start: 2023-03-21 — End: 2023-03-21
  Filled 2023-03-21: qty 2

## 2023-03-21 NOTE — ED Triage Notes (Signed)
Father gives the history stating her physician said she would call in ativan and Zofran but she did not and the office is closed today. She goes to [facility].

## 2023-03-21 NOTE — ED Nurses Note (Signed)

## 2023-03-21 NOTE — ED Provider Notes (Signed)
[facility], [facility] Emergency [facility]  ED Primary Provider Note  History of Present Illness   Chief Complaint   Patient presents with    Medication Refill     Shannon Rodriguez is a 27 y.o. female who had concerns including Medication Refill.  Arrival: The patient arrived by Car complaining of needing her Ativan and her Zofran this morning.  Patient is out of both of them and they are trying to get a hold of the family physician.  In the meantime she had a small seizure this morning.  She normally is on Tegretol and Vimpat for her seizures.  The Ativan is trying to stopped the seizures from coming.  Patient is extremely nervous sitting in front of me.  She denies any pain presently.  She did not bite her tongue nor have incontinence of urine or stool.  She denies any fever chills.    HPI  Review of Systems   Review of Systems   Constitutional:  Positive for activity change and appetite change. Negative for chills and fever.   HENT:  Negative for ear pain and sore throat.    Eyes:  Negative for pain and visual disturbance.   Respiratory:  Negative for cough and shortness of breath.    Cardiovascular:  Negative for chest pain and palpitations.   Gastrointestinal:  Negative for abdominal pain and vomiting.   Genitourinary:  Negative for dysuria and hematuria.   Musculoskeletal:  Negative for arthralgias and back pain.   Skin:  Negative for color change and rash.   Neurological:  Positive for seizures. Negative for syncope.   All other systems reviewed and are negative.     Historical Data   History Reviewed This Encounter:     Physical Exam   ED Triage Vitals [03/21/23 1300]   BP (Non-Invasive) (!) 137/98   Heart Rate 74   Respiratory Rate 17   Temperature 37.2 C (99 F)   SpO2 98 %   Weight 55.3 kg (122 lb)   Height 1.6 m (5\' 3" )     Physical Exam  Vitals and nursing note reviewed.   Constitutional:       General: She is not in acute distress.     Appearance: She is well-developed  and normal weight.   HENT:      Head: Normocephalic and atraumatic.      Right Ear: External ear normal.      Left Ear: External ear normal.      Nose: Nose normal.      Mouth/Throat:      Mouth: Mucous membranes are dry.   Eyes:      Extraocular Movements: Extraocular movements intact.      Conjunctiva/sclera: Conjunctivae normal.      Pupils: Pupils are equal, round, and reactive to light.   Cardiovascular:      Rate and Rhythm: Normal rate and regular rhythm.      Pulses: Normal pulses.      Heart sounds: Normal heart sounds. No murmur heard.  Pulmonary:      Effort: Pulmonary effort is normal. No respiratory distress.      Breath sounds: Normal breath sounds.   Abdominal:      General: Bowel sounds are normal.      Palpations: Abdomen is soft.      Tenderness: There is no abdominal tenderness.   Musculoskeletal:         General: No swelling. Normal range of motion.  Cervical back: Normal range of motion and neck supple.   Skin:     General: Skin is warm and dry.      Capillary Refill: Capillary refill takes less than 2 seconds.   Neurological:      General: No focal deficit present.      Mental Status: She is alert and oriented to person, place, and time.   Psychiatric:         Mood and Affect: Mood normal.         Behavior: Behavior normal.         Thought Content: Thought content normal.       Patient Data   Labs Ordered/Reviewed - No data to display  No orders to display     Medical Decision Making        Medical Decision Making  Patient is 27 year old white female with history of seizures presently on Tegretol and Vimpat.  She ran out of Ativan and Zofran.  She asked if we could give her some pills hold her over until she is able to see her family physician.  Father stated that he has not knee office today.  I told the father that I was able to give 3 days' worth of medication.  Patient has small seizure this morning.  She has also had several seizures at nighttime over the past week.  Patient denies  having any fever chills.  No headache.  No recent cough.  No chest pain or shortness breath.  Patient is extremely anxious.  The father stated that that triggers some of her seizures.  Patient will follow up with family physician in the next 3 days.             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

## 2023-06-23 NOTE — Telephone Encounter (Signed)
 Chart reviewed with the following information noted:    Most recent PMP reviewed on 06/23/23, and is appropriate.  Most recent UDS:  05/23/23  Most recent appointment: 05/23/2023  SMA signed with clinic: Yes    Will refill as requested.    [name], DO

## 2023-07-07 NOTE — Progress Notes (Signed)
 Called [facility] to get MRI report faxed over to our office

## 2023-07-09 NOTE — Progress Notes (Signed)
 Shannon Rodriguez is a 27 y.o. female  Patient here for f/u chronic issues, may need refills.

## 2023-07-09 NOTE — Progress Notes (Signed)
 [facility]     Family Practice Office Visit with Shannon P Nuckels, DO    ASSESSMENT/PLAN:     Encounter Diagnosis     ICD-10-CM    1. Epilepsy symptomatic, generalized (HCC)  G40.409 CARBAMAZEPINE TOTAL (CRBM)     BASIC METABOLIC PANEL (BMP)      2. High risk medication use  Z79.899 CARBAMAZEPINE TOTAL (CRBM)      3. 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 TRACHOMATIS/N.GONORRHOEAE RNA (T207)      5. Vaginal discharge  N89.8 AMB REFERRAL TO GYNECOLOGY     VAGINAL MICROBIAL DNA PROBE (TCGD)     C. TRACHOMATIS/N.GONORRHOEAE RNA (T207)     CANCELED: C. TRACHOMATIS/N.GONORRHOEAE RNA (T207)     CANCELED: C. TRACHOMATIS/N.GONORRHOEAE RNA (T207)      6. Vulvar candidiasis  B37.31 C. TRACHOMATIS/N.GONORRHOEAE RNA (T207)      7. Elevated carbamazepine level  R78.89 CARBAMAZEPINE TOTAL (CRBM)        Pt plans to see neurosurgery on 12/10 and get recommendations for a neurologist. I reiterated that she needs a neurologist to best manage her epilepsy. They expressed understanding of this.   For now, will check tegratol level again since it was previously elevated and make any necessary adjustments.     Her seizures appear fairly well-controlled on her current regimen.  Dad has been slowly helping to wean her off the lorazepam reporting that often he gives her half of the 2 mg tablets instead of a full tablet during her daily doses.  He did report that he has noted some afternoon somnolence which I suspect is likely 2/2 her medication use.  Plan is to continue slowly decreasing lorazepam dose as tolerated.  Dad reports that after this month he has had 7-8 additional tablets left.  Will have patient's father record the dosages of lorazepam the patient requires over the upcoming months and if she is consistently taking 1 mg tablets will decrease prescribed dose to match this.  Father expressed understanding and agreement with  this.    Patient has chronic hyponatremia of unclear etiology.  Could be 2/2 epilepsy or medications to treat epilepsy.  Will repeat BMP today and continue to monitor for any acute changes.    Vaginal exam performed in office today with chaperone present.  Did not see obvious signs of vaginal prolapse (rectocele or cystocele).  Did note some mild enlargement of the urethra.  Also noted erythema in the vulvar area that I suspect is vulvar candidiasis.  Will prescribe topical nystatin cream to apply to the vulvar area.  Advised patient to stop using Vaseline in that area.  Suspect candidiasis is worsened by moist area from patient's chronic incontinence and diaper use.  Advised her to change her adult diapers more frequently to help prevent recurrence of this.  Did note greenish/yellowish discharge in vaginal canal on exam.  Patient denies being previously sexually active, however given the appearance of the discharge on exam gonorrhea, chlamydia, trichomonas, BV and candidiasis.    Patient has been experiencing a fullness sensation and vaginal pain times unclear cause.  Will place referral to gynecology for further evaluation.    RTC in 4 weeks or earlier if needed.    Shannon P Nuckels, DO       SUBJECTIVE:   Chief Complaint   Patient presents with   . Follow-up  Patient here for f/u chronic issues, may need refills.      Shannon Rodriguez is a 27 y.o. female who presents with the following issues.      Seizure disorder  Was supposed to find a new neurologist-hasn't yet  Has appt with a neurosurgeon on 12/10, plans to ask their opinion for a good neurologist  Doing better  Dad said she's had some episodes   Tegratol dose-6 in AM and 6 in PM  Lorazepam-has been trying to space the lorazepam, hoping to wean down a little more; dad has been cutting the 2mg  in half, only giving the full 2mg  once a day and the other 2 times taking 1mg    3 nights ago had 3 seizures and he gave her a full 2mg  and they  stopped      Pituitary issue is probably making her tired  Sleeping a lot  Saw an endocrinologist (Dr. Johnston Nao) and was told that her hormone levels were all good    Vaginal prolapse  Gyn-planning to see, hasn't made an appt yet      ROS  As noted above    Past medical history and medications reviewed and chart updated as appropriate      OBJECTIVE:  BP 138/87   Pulse 72   Temp 99.2 F (37.3 C)   Resp 18   Ht 1.626 m (5\' 4" )   Wt 59.1 kg (130 lb 6.4 oz)   SpO2 97%   BMI 22.38 kg/m    Estimated body mass index is 22.38 kg/m as calculated from the following:    Height as of this encounter: 1.626 m (5\' 4" ).    Weight as of this encounter: 59.1 kg (130 lb 6.4 oz).    Physical Exam  Exam conducted with a chaperone present.   Constitutional:       General: She is not in acute distress.  Eyes:      Extraocular Movements: Extraocular movements intact.      Conjunctiva/sclera: Conjunctivae normal.   Cardiovascular:      Rate and Rhythm: Normal rate.      Heart sounds: Normal heart sounds. No murmur heard.  Pulmonary:      Effort: Pulmonary effort is normal.      Breath sounds: Normal breath sounds. No wheezing.   Abdominal:      Palpations: Abdomen is soft.      Tenderness: There is no abdominal tenderness.   Genitourinary:     Exam position: Lithotomy position.      Labia:         Right: No rash or lesion.         Left: No rash or lesion.       Vagina: Normal. No bleeding or lesions.      Cervix: No cervical motion tenderness, friability, lesion, erythema or cervical bleeding.      Comments: Vulvar erythema b/l, no swelling or lesions, no internal lesions, unable to view cervix, yellow/grayish discharge in vaginal canal  Skin:     General: Skin is warm and dry.   Neurological:      General: No focal deficit present.      Mental Status: She is alert.      Comments: B/l action tremor

## 2023-11-15 ENCOUNTER — Other Ambulatory Visit: Payer: Self-pay

## 2023-11-15 ENCOUNTER — Emergency Department (HOSPITAL_BASED_OUTPATIENT_CLINIC_OR_DEPARTMENT_OTHER)

## 2023-11-15 ENCOUNTER — Encounter (HOSPITAL_BASED_OUTPATIENT_CLINIC_OR_DEPARTMENT_OTHER): Payer: Self-pay

## 2023-11-15 ENCOUNTER — Emergency Department
Admission: EM | Admit: 2023-11-15 | Discharge: 2023-11-15 | Disposition: A | Discharge status: Home / Self Care | Source: Home / Self Care | Attending: FAMILY PRACTICE | Admitting: FAMILY PRACTICE

## 2023-11-15 DIAGNOSIS — F1990 Other psychoactive substance use, unspecified, uncomplicated: Secondary | ICD-10-CM

## 2023-11-15 DIAGNOSIS — Z79899 Other long term (current) drug therapy: Secondary | ICD-10-CM | POA: Insufficient documentation

## 2023-11-15 DIAGNOSIS — Z760 Encounter for issue of repeat prescription: Secondary | ICD-10-CM | POA: Insufficient documentation

## 2023-11-15 DIAGNOSIS — T424X6A Underdosing of benzodiazepines, initial encounter: Secondary | ICD-10-CM

## 2023-11-15 DIAGNOSIS — Z91148 Patient's other noncompliance with medication regimen for other reason: Secondary | ICD-10-CM | POA: Insufficient documentation

## 2023-11-15 DIAGNOSIS — E871 Hypo-osmolality and hyponatremia: Secondary | ICD-10-CM | POA: Insufficient documentation

## 2023-11-15 DIAGNOSIS — G40909 Epilepsy, unspecified, not intractable, without status epilepticus: Secondary | ICD-10-CM | POA: Insufficient documentation

## 2023-11-15 LAB — CBC WITH DIFF
BASOPHIL #: 0 10*3/uL (ref 0.00–0.10)
BASOPHIL %: 0 % (ref 0–1)
EOSINOPHIL #: 0.14 10*3/uL (ref 0.00–0.50)
EOSINOPHIL %: 3 % (ref 1–7)
HCT: 37.3 % (ref 31.2–41.9)
HGB: 12 g/dL (ref 10.9–14.3)
LYMPHOCYTE #: 1.43 10*3/uL (ref 1.10–3.10)
LYMPHOCYTE %: 30 % (ref 16–46)
MCH: 23.7 pg — ABNORMAL LOW (ref 24.7–32.8)
MCHC: 32.1 g/dL — ABNORMAL LOW (ref 32.3–35.6)
MCV: 73.9 fL — ABNORMAL LOW (ref 75.5–95.3)
MONOCYTE #: 0.38 10*3/uL (ref 0.20–0.90)
MONOCYTE %: 8 % (ref 4–11)
MPV: 8.6 fL (ref 7.9–10.8)
NEUTROPHIL #: 2.81 10*3/uL (ref 1.90–8.20)
NEUTROPHIL %: 59 % (ref 43–77)
PLATELETS: 291 10*3/uL (ref 140–440)
RBC: 5.05 10*6/uL — ABNORMAL HIGH (ref 3.63–4.92)
RDW: 19.5 % — ABNORMAL HIGH (ref 12.3–17.7)
WBC: 4.8 10*3/uL (ref 3.8–11.8)

## 2023-11-15 LAB — COMPREHENSIVE METABOLIC PANEL, NON-FASTING
ALBUMIN/GLOBULIN RATIO: 1.1 (ref 0.8–1.4)
ALBUMIN: 3.9 g/dL (ref 3.4–5.0)
ALKALINE PHOSPHATASE: 67 U/L (ref 46–116)
ALT (SGPT): 25 U/L (ref <=78)
ANION GAP: 7 mmol/L (ref 4–13)
AST (SGOT): 16 U/L (ref 15–37)
BILIRUBIN TOTAL: 0.1 mg/dL — ABNORMAL LOW (ref 0.2–1.0)
BUN/CREA RATIO: 13
BUN: 7 mg/dL (ref 7–18)
CALCIUM, CORRECTED: 9 mg/dL
CALCIUM: 8.9 mg/dL (ref 8.5–10.1)
CHLORIDE: 99 mmol/L (ref 98–107)
CO2 TOTAL: 29 mmol/L (ref 21–32)
CREATININE: 0.52 mg/dL — ABNORMAL LOW (ref 0.55–1.02)
ESTIMATED GFR: 131 mL/min/{1.73_m2} (ref >59)
GLOBULIN: 3.4
GLUCOSE: 102 mg/dL (ref 74–106)
OSMOLALITY, CALCULATED: 268 mosm/kg — ABNORMAL LOW (ref 270–290)
POTASSIUM: 4.2 mmol/L (ref 3.5–5.1)
PROTEIN TOTAL: 7.3 g/dL (ref 6.4–8.2)
SODIUM: 135 mmol/L — ABNORMAL LOW (ref 136–145)

## 2023-11-15 LAB — DRUG SCREEN, NO CONFIRMATION, URINE
AMPHETAMINES URINE: NEGATIVE
BARBITURATES URINE: NEGATIVE
BENZODIAZEPINES URINE: NEGATIVE
CANNABINOIDS URINE: NEGATIVE
COCAINE METABOLITES URINE: NEGATIVE
METHADONE URINE: NEGATIVE
OPIATES URINE: NEGATIVE
PCP URINE: NEGATIVE

## 2023-11-15 LAB — CARBAMAZEPINE (TEGRETOL) LEVEL: CARBAMAZEPINE LEVEL: 9 ug/mL (ref 4.0–12.0)

## 2023-11-15 LAB — AMMONIA: AMMONIA: 20 umol/L (ref 11–32)

## 2023-11-15 MED ORDER — LORAZEPAM 2 MG TABLET
2.0000 mg | ORAL_TABLET | Freq: Two times a day (BID) | ORAL | 0 refills | Status: AC
Start: 2023-11-15 — End: 2023-11-17

## 2023-11-15 MED ORDER — SODIUM CHLORIDE 0.9 % (FLUSH) INJECTION SYRINGE
3.0000 mL | INJECTION | Freq: Three times a day (TID) | INTRAMUSCULAR | Status: DC
Start: 2023-11-15 — End: 2023-11-15

## 2023-11-15 MED ORDER — SODIUM CHLORIDE 0.9 % (FLUSH) INJECTION SYRINGE
3.0000 mL | INJECTION | INTRAMUSCULAR | Status: DC | PRN
Start: 2023-11-15 — End: 2023-11-15

## 2023-11-15 NOTE — ED Provider Notes (Signed)
 [facility], [facility] - Emergency Department  ED Primary Provider Note  History of Present Illness   Chief Complaint   Patient presents with    Medication Refill     Patient has epilepsy and ran out of 2mg  lorazepam. Has history of seizures. Requesting a refill. Has an appointment with her neurologist on 4/22. States it is scheduled to be refilled tomorrow but their pharmacy is closed on Sunday.       Shannon Rodriguez is a 28 y.o. female who had concerns including Medication Refill.  Arrival: The patient arrived by Car    This 28 year old female patient presents emergency department for medication refill of Ativan.  Father states she was taking Ativan up to 5 times a day, change provider's and she is down to 3 times per day, and she was to run out of her medications today, states that her primary care provider was called, [place] the Tegretol but did not feel the Ativan as he is looked on MyChart and called [facility].  Her father brought her in here to have a bridge Ativan to last until Tuesday.  Did advise him that we do not refill chronic medications in the emergency department, most especially controlled substances.  She has multiple visits to the emergency department, has seizures without postictal states.  She does have a slightly low sodium level, this may be due to the Tegretol.  She has been on including nasal Versed, oral Ativan, and Klonopin.  She denies any fever, sweats, chills, nausea or vomiting, cough, cold, congestion or pharyngitis.  No headaches, body aches or joint aches.  Her past medical history is significant for seizure disorder, scoliosis, pituitary disorder of unknown type and she has a left femoral hernia.  She has had no surgeries.  She sees neurology in [place], has also seen neurosurgery.  I did not locate any EEGs in her chart.  Ears no tobacco, marijuana, vaping, alcohol or illicit drug use.    She has been COVID vaccinated, and flu vaccinated.  She has had  COVID twice.  No flu this season        History Reviewed This Encounter:  Patient's past medical, surgical, social history reviewed noted.    Physical Exam   ED Triage Vitals [11/15/23 1210]   BP (Non-Invasive) (!) 136/94   Heart Rate 70   Respiratory Rate 18   Temperature 36.7 C (98 F)   SpO2 98 %   Weight 55.3 kg (122 lb)   Height 1.6 m (5\' 3" )     Physical Exam  General: No acute distress nontoxic   Eyes: Sclera is white, conjunctivae is pink, PERRLA, EOMI.    Ears:  TMs clear throat for traction   Nasal:  Pink and moist   Oral: Pink and moist   Pharynx: Pink and moist without PND, exudate, petechiae   Neck: Supple   Lungs: Clear symmetrical with good aeration   Heart: Regular rate rhythm without murmur   Abdomen: Soft normal bowel sounds nontender   Extremities:  Moving symmetrically   Neurological: No gross focal deficits.    Patient Data     Labs Ordered/Reviewed   COMPREHENSIVE METABOLIC PANEL, NON-FASTING - Abnormal; Notable for the following components:       Result Value    SODIUM 135 (*)     CREATININE 0.52 (*)     BILIRUBIN TOTAL 0.1 (*)     OSMOLALITY, CALCULATED 268 (*)     All other components within  normal limits    Narrative:     Estimated Glomerular Filtration Rate (eGFR) is calculated using the CKD-EPI (2021) equation, intended for patients 70 years of age and older. If gender is not documented or "unknown", there will be no eGFR calculation.   CBC WITH DIFF - Abnormal; Notable for the following components:    RBC 5.05 (*)     MCV 73.9 (*)     MCH 23.7 (*)     MCHC 32.1 (*)     RDW 19.5 (*)     All other components within normal limits   AMMONIA - Normal   DRUG SCREEN, NO CONFIRMATION, URINE - Normal   CBC/DIFF    Narrative:     The following orders were created for panel order CBC/DIFF.  Procedure                               Abnormality         Status                     ---------                               -----------         ------                     CBC WITH YNWG[[id number]]                 Abnormal            Final result                 Please view results for these tests on the individual orders.   CARBAMAZEPINE (TEGRETOL) LEVEL     No orders to display     Medical Decision Making        Medical Decision Making  Moderate complexity straightforward, here for medication refill, will check electrolytes, liver kidney function test, CBC and Tegretol levels.  We will do a urine drug screen as well today.    Pink advised that she will not have a refill of her medications to bridge her totally, she will get 3 Ativan, 1 for this evening, and 1 for tomorrow morning and 1 for tomorrow evening.  They must call her primary care provider for refills.    Amount and/or Complexity of Data Reviewed  Labs: ordered.    Risk  Prescription drug management.      ED Course as of 11/15/23 1727   Sat Nov 15, 2023   1720 Still waiting to Tegretol level that was drawn at 1:18 p.m., apparently still sitting in our lab and not picked up from here and sent to [facility] to be run.  Drug screen is negative even benzodiazepines, and she is supposed to be on Ativan 3 times daily, that is not drawn out until tomorrow.  She will be given 3 Ativan to last her until Tuesday, we will notify them with a Tegretol level once it is completed.            Medications Ordered/Administered in the ED   NS flush syringe (has no administration in time range)   NS flush syringe (has no administration in time range)     Clinical Impression   Encounter for medication refill (Primary)   Seizure disorder (CMS HCC)  Hyponatremia   Misuse of medication       Disposition: Discharged

## 2023-11-15 NOTE — ED Nurses Note (Signed)
 Pt resting in bed with lights on and door open. Respirations even and unlabored with equal chest rise and fall. No acute s/s of distress noted at this time. Family at bedside. Plan of care ongoing at this time.

## 2023-11-15 NOTE — ED Nurses Note (Signed)
 Pt with new orders to DC home. Ensured that dc note was updated by physician. Discussed dc wtih patient. Printed dc paperwork and provided pt wtih a copy, reading over important points to pt. All of the pt's questions answered at this time. Provided pt with clinic phone number for follow-up questions.  Pt transported off ward via ambulatory with friend.

## 2023-11-15 NOTE — Discharge Instructions (Signed)
 You will be given 3 Ativan tablets, you must follow up with your primary care provider for your medication refills, we are not supposed to be refilling any chronic prescription medications.  You should not be running all your medications until tonight, at the earliest but actually not until tomorrow, by doing the pill count from 90 tablets at 3 times a day.    Need to call follow-up with your primary care provider, neurologist, and endocrinologist.  Continue home medications as prescribed.

## 2023-11-15 NOTE — ED Nurses Note (Signed)
 Pt friend to desk at this time stating that they are only willing to wait 15 more minutes and then they are leaving. Adv that we are waiting on one last part of the blood work to come back before md can make a decision about writing a prescription and that we will let him know as soon it comes back. Pt friend states he's not waiting longer to just get six pills, and walked back to the room.

## 2023-11-15 NOTE — ED Nurses Note (Signed)
 Patient states she needs refill of lorazepam. Patient states she has history of seizures. Patient states she has appointment with neurologist on 4/22 but needs a refill before appointment.
